# Patient Record
Sex: Female | Born: 2000 | Race: White | Hispanic: No | Marital: Single | Smoking: Never smoker
Health system: Southern US, Community
[De-identification: ages and names within clinical notes are randomized; demographics above are authoritative.]

## PROBLEM LIST (undated history)

## (undated) DIAGNOSIS — N6009 Solitary cyst of unspecified breast: Secondary | ICD-10-CM

## (undated) DIAGNOSIS — R569 Unspecified convulsions: Secondary | ICD-10-CM

## (undated) DIAGNOSIS — G40909 Epilepsy, unspecified, not intractable, without status epilepticus: Secondary | ICD-10-CM

## (undated) HISTORY — PX: BIOPSY BREAST: PRO8

## (undated) HISTORY — DX: Unspecified convulsions: R56.9

---
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
## 2018-02-01 ENCOUNTER — Other Ambulatory Visit (INDEPENDENT_AMBULATORY_CARE_PROVIDER_SITE_OTHER): Payer: Self-pay | Admitting: Pediatrics

## 2018-02-01 DIAGNOSIS — G40B09 Juvenile myoclonic epilepsy, not intractable, without status epilepticus: Secondary | ICD-10-CM

## 2018-02-01 MED FILL — DEPAKOTE ER 250 MG TABLET: 250 | 90 days supply | Qty: 270 | Fill #0

## 2018-02-06 ENCOUNTER — Ambulatory Visit (INDEPENDENT_AMBULATORY_CARE_PROVIDER_SITE_OTHER): Payer: 59 | Admitting: Pediatrics

## 2018-02-13 ENCOUNTER — Encounter (INDEPENDENT_AMBULATORY_CARE_PROVIDER_SITE_OTHER): Payer: Self-pay

## 2018-02-13 ENCOUNTER — Ambulatory Visit (INDEPENDENT_AMBULATORY_CARE_PROVIDER_SITE_OTHER): Payer: 59 | Admitting: Pediatrics

## 2018-02-13 VITALS — BP 110/70 | HR 74 | Ht 62.0 in | Wt 169.2 lb

## 2018-02-13 DIAGNOSIS — G40B09 Juvenile myoclonic epilepsy, not intractable, without status epilepticus: Secondary | ICD-10-CM | POA: Diagnosis not present

## 2018-02-13 MED ORDER — DEPAKOTE ER 250 MG PO TB24: 750.0000 mg | ORAL_TABLET | Freq: Every day | ORAL | 3 refills | Status: DC

## 2018-02-13 NOTE — Progress Notes (Signed)
Patient: Joan Shepard MRN: [id number] Sex: female DOB: [birth year]  Provider: Wyline Copas, MD Location of Care: [facility]  Note type: Routine return visit  History of Present Illness: Referral Source: Aleda Grana, MD History from: patient, [facility] chart and Mom Chief Complaint: Epilepsy, medication change/review  Joan Shepard is a 17 y.o. female who was evaluated on Feb 13, 2018 for the first time since January 03, 2017.  She has juvenile myoclonic epilepsy with history of migraine and tension-type headaches.  She has been treated with Lamictal, which affected her school performance, personality, and caused weight gain, topiramate and zonisamide did not control her seizures.  Depakote has completely controlled all of her seizures including generalized tonic-clonic and myoclonic seizures.  I explained to Joan Shepard that if we wanted to make a transition away from Depakote, that the next medication would be levetiracetam.  I forgotten how Lamictal had effected her.  I explained that we would introduce levetiracetam without any change in Depakote.  Once she was taking about 1500 mg a day, I would slowly taper Depakote.  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  She begins school on August 10.  She is going to travel to the beach and [place] this summer with her family.  I am pleased that she lost 10 pounds since I saw her 13 months ago.  She has been working out and also watching her oral intake.  She graduates in [employer] in another few weeks.  She has a permanent driver's license.  She had a problem with headaches, but did not raise any concerns about that today.  Headaches if they have been present, have been few and far between.  She is not keeping headache record.  Review of Systems: A complete review of systems was assessed and was negative.  Past Medical History Diagnosis Date   . Seizures ([place])    Hospitalizations: No., Head Injury: No., Nervous System Infections: No., Immunizations up to date: Yes.    She was admitted 4 days of life with perioral cellulitis and hospitalized for 7-10 days.  Seizures began in 2008. She was treated with Lamictal, but had problems with school performance, personality change, and weight gain. The medication was stopped and curiously she had no recurrent seizures until August 15, 2011.  EEG showed a photo-convulsive response.   EEG August 11, 2011 showed photoconvulsive response.   Prolonged ambulatory EEG showed seven episodes, three to five seconds in duration associated with clinical and compliments of unresponsive staring, rising of her arms, and shaking of them. She was placed on topiramate, which did not control her seizures. She was switched to zonisamide and had a flurry of seizures in early to mid December 2013.  In March 2014, she had a series of seizures at school where she lost balance, dropped her books, and had myoclonic movements of her arms and trunk without loss of consciousness.   A decision was made to start Depakote, which has proved beneficial for seizure control  Birth History 7 lbs. 6 oz. infant born at [redacted] weeks gestational age to a G 3 P 1 0 1 1 female. Gestation was uncomplicated Mother received Pitocin and Spinal anesthesia  normal spontaneous vaginal delivery Nursery Course was uncomplicated Growth and Development was recalled as normal  Behavior History none  Surgical History History reviewed. No pertinent surgical history.  Family History family history includes  ADD / ADHD in her other; Heart Problems in her maternal grandfather; Mood Disorder in her other; Seizures in her brother, maternal grandfather, maternal uncle, mother, other, and other. Family history is negative for migraines, intellectual disabilities, blindness, deafness, birth defects, chromosomal disorder, or  autism.  Social History Social Needs  . Financial resource strain: Not on file  . Food insecurity:    Worry: Not on file    Inability: Not on file  . Transportation needs:    Medical: Not on file    Non-medical: Not on file  Tobacco Use  . Smoking status: Never Smoker  . Smokeless tobacco: Never Used  Substance and Sexual Activity  . Alcohol use: No  . Drug use: No  . 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    She attends [employer].     She lives with her parents.     Enjoys volunteering and volleyball.    Allergies Allergen Reactions  . Lamotrigine     Weight gain, and altered mood   Physical Exam BP 110/70   Pulse 74   Ht 5\' 2"  (1.575 m)   Wt 169 lb 3.2 oz (76.7 kg)   BMI 30.95 kg/m   General: alert, well developed, obese, in no acute distress, sandy hair, blue eyes, right handed Head: normocephalic, no dysmorphic features Ears, Nose and Throat: Otoscopic: tympanic membranes normal; pharynx: oropharynx is pink without exudates or tonsillar hypertrophy Neck: supple, full range of motion, no cranial or cervical bruits Respiratory: auscultation clear Cardiovascular: no murmurs, pulses are normal Musculoskeletal: no skeletal deformities or apparent scoliosis Skin: no rashes or neurocutaneous lesions  Neurologic Exam  Mental Status: alert; oriented to person, place and year; knowledge is normal for age; language is normal Cranial Nerves: visual fields are full to double simultaneous stimuli; extraocular movements are full and conjugate; pupils are round reactive to light; funduscopic examination shows sharp disc margins with normal vessels; symmetric facial strength; midline tongue and uvula; air conduction is greater than bone conduction bilaterally Motor: Normal strength, tone and mass; good fine motor movements; no pronator drift Sensory: intact responses to cold, vibration, proprioception and  stereognosis Coordination: good finger-to-nose, rapid repetitive alternating movements and finger apposition Gait and Station: normal gait and station: patient is able to walk on heels, toes and tandem without difficulty; balance is adequate; Romberg exam is negative; Gower response is negative Reflexes: symmetric and diminished bilaterally; no clonus; bilateral flexor plantar responses  Assessment 1.  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  Discussion Joan Shepard is doing well.  However, we want to get her off of Depakote  but because of its possible teratogenic effects.  She is not sexually active at this time,  Plan It is my intention to introduce levetiracetam after she returns to school, although it would not be an unreasonable thing to start this summer to make certain that she can tolerate it.  I would place her on 500 mg tablets 1/2 twice daily for a week, 1 twice daily for a week, and then 1-1/2 twice daily.  Once she starts to taper Depakote, she would have to stop driving until she was off Depakote and seizure-free for at least 6 months.  We can certainly accomplish this in her freshman year.  If successful, this would be a much better situation for her with a significantly lowered risk of giving birth to a child with deformities when and if she  gets married and has a family.  The only other alternatives would be topiramate and zonisamide.  I spent 25 minutes of face-to-face time with Joan Shepard and her mother, more than half of it in consultation.  She will return over Christmas break about 7 months from now.  I asked her to sign up for MyChart and requested that she identify a pharmacy in [facility], so that we can send a prescription there to minimize any chances of her running out of medicine.  I refilled a prescription for Depakote ER 250 mg tablets 3 at bedtime.   Medication List    Accurate as of 02/13/18 11:59 PM.      DEPAKOTE ER 250 MG 24 hr  tablet Generic drug:  divalproex Take 3 tablets (750 mg total) by mouth at bedtime.   Melatonin 10 MG Tabs Take 10 mg by mouth at bedtime.    The medication list was reviewed and reconciled. All changes or newly prescribed medications were explained.  A complete medication list was provided to the patient/caregiver.  Princess Bruins Babita Amaker

## 2018-02-13 NOTE — Patient Instructions (Signed)
Have a great summer.  I like to see you when you are home for Christmas holiday.  Please sign up for My Chart so that you can communicate with me if there are any problems with your medication or you have recurrent seizures.  We talked about the medicines Keppra (levetiracetam), and Lamictal (lamotrigine).  Keppra is likely to treat your myoclonus whereas with ictal sometimes treats myoclonus and sometimes makes it worse.  Both would be safe for you to go into adulthood with.  We will not have any trouble introducing Keppra.  Lamictal and Depakote share the same enzyme system and that will be tricky.

## 2018-04-12 ENCOUNTER — Other Ambulatory Visit: Payer: Self-pay

## 2018-04-12 ENCOUNTER — Encounter (HOSPITAL_COMMUNITY): Payer: Self-pay | Admitting: Obstetrics and Gynecology

## 2018-04-12 ENCOUNTER — Emergency Department (HOSPITAL_COMMUNITY)
Admission: EM | Admit: 2018-04-12 | Discharge: 2018-04-12 | Disposition: A | Discharge status: Home / Self Care | Payer: 59 | Attending: Emergency Medicine | Admitting: Emergency Medicine

## 2018-04-12 DIAGNOSIS — S060X0A Concussion without loss of consciousness, initial encounter: Secondary | ICD-10-CM | POA: Diagnosis not present

## 2018-04-12 DIAGNOSIS — Z041 Encounter for examination and observation following transport accident: Secondary | ICD-10-CM | POA: Diagnosis present

## 2018-04-12 DIAGNOSIS — Z79899 Other long term (current) drug therapy: Secondary | ICD-10-CM | POA: Insufficient documentation

## 2018-04-12 DIAGNOSIS — S50811A Abrasion of right forearm, initial encounter: Secondary | ICD-10-CM | POA: Diagnosis not present

## 2018-04-12 DIAGNOSIS — Y999 Unspecified external cause status: Secondary | ICD-10-CM | POA: Insufficient documentation

## 2018-04-12 DIAGNOSIS — Y929 Unspecified place or not applicable: Secondary | ICD-10-CM | POA: Insufficient documentation

## 2018-04-12 DIAGNOSIS — R42 Dizziness and giddiness: Secondary | ICD-10-CM | POA: Insufficient documentation

## 2018-04-12 DIAGNOSIS — R110 Nausea: Secondary | ICD-10-CM | POA: Insufficient documentation

## 2018-04-12 DIAGNOSIS — Y939 Activity, unspecified: Secondary | ICD-10-CM | POA: Diagnosis not present

## 2018-04-12 HISTORY — DX: Epilepsy, unspecified, not intractable, without status epilepticus: G40.909

## 2018-04-12 HISTORY — DX: Solitary cyst of unspecified breast: N60.09
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
## 2018-04-12 NOTE — Discharge Instructions (Addendum)
Your symptoms are most likely due to a concussion. Read attached information on concussion.   Concussion symptoms include include headache, nausea, sleep disturbance, dizziness, short term memory loss. Usually these symptom improve slowly over a period of 2-3 weeks, usually sooner. These symptoms wax and wane, and can be worsened by physical or cognitive exertion like reading, watching Tv, studying, using computer, playing video games.   Take acetaminophen or ibuprofen for associated pain. We recommend physical and cognitive rest for the next 48-72 hours. This means avoid any exertional or cognitive activity that makes your symptoms worse including reading, playing video games, watching TV, exercising, jumping, playing sports.  The most important part of concussion management is avoiding a repeat head injury, do not perform any activities that will put you at risk for a second head injury. You must be cleared by a clinician before fully returning to contact sports. If your symptoms last longer than 2-3 week you need to further evaluation by primary care provider or concussion specialist.   Return to the emergency department if you develop mental status change, lethargy, vomiting, vision changes, worsening headaches, numbness or heaviness/weakness to extremities, seizures.

## 2018-04-30 MED FILL — DEPAKOTE ER 250 MG TABLET: 250 | 90 days supply | Qty: 270 | Fill #1

## 2018-05-31 ENCOUNTER — Encounter (INDEPENDENT_AMBULATORY_CARE_PROVIDER_SITE_OTHER): Payer: Self-pay

## 2018-07-29 MED FILL — DEPAKOTE ER 250 MG TABLET: 250 | 90 days supply | Qty: 270 | Fill #2

## 2018-09-13 ENCOUNTER — Ambulatory Visit (INDEPENDENT_AMBULATORY_CARE_PROVIDER_SITE_OTHER): Payer: 59 | Admitting: Pediatrics

## 2018-09-13 ENCOUNTER — Encounter (INDEPENDENT_AMBULATORY_CARE_PROVIDER_SITE_OTHER): Payer: Self-pay | Admitting: Pediatrics

## 2018-09-13 VITALS — BP 120/80 | HR 76 | Ht 62.25 in | Wt 155.0 lb

## 2018-09-13 DIAGNOSIS — G40B09 Juvenile myoclonic epilepsy, not intractable, without status epilepticus: Secondary | ICD-10-CM | POA: Diagnosis not present

## 2018-09-13 MED ORDER — DEPAKOTE ER 250 MG PO TB24: 750.0000 mg | ORAL_TABLET | Freq: Every day | ORAL | 3 refills | Status: DC

## 2018-09-13 NOTE — Patient Instructions (Signed)
I am pleased that you are doing well.  Please return this spring and we will start levetiracetam.

## 2018-09-13 NOTE — Progress Notes (Signed)
Patient: Joan Shepard MRN: [id number] Sex: female DOB: 2001-06-19  Provider: Wyline Copas, MD Location of Care: [facility]  Note type: Routine return visit  History of Present Illness: Referral Source: Aleda Grana, MD History from: mother, patient and [facility] chart Chief Complaint: Epilepsy/Medication change  Joan Shepard is a 17 y.o. female who returns September 13, 2018 for first time since Feb 13, 2018.  She has juvenile myoclonic epilepsy and a history of migraine and tension-type headaches.  Lamictal affected her school performance, personality, and caused weight gain, topiramate and zonisamide did not control her seizures.  Depakote completely controlled her seizures.  I do not want her going into adulthood on Depakote unless it is there is no other option.  I recommended introducing levetiracetam at a time when she can not have her life significantly affected by the needing to drive.  She has just finished her first term at [employer] and had a good semester.  She came back today to consider whether we should discontinue medication, but she needs to be able to drive this summer.  We decided to keep her on Depakote until she heads back to school this fall.  She tells me that she has had a couple of episodes where she had shivering and/or muscle spasms while lying in bed trying to fall asleep.  She had been out late and had taken her medication late.  She did not feel the same way that she feels when she experiences seizures.  I am not certain that these episodes reflect seizures.  These occurred about a month apart.  After that she has been more careful to take her medication at a set time and when she has done so, the episodes did not recur.  She takes Loestrin to control her periods.  She has lost 14 pounds since I saw her.  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  She is majoring in Freight forwarder.  Review of Systems: A complete  review of systems was remarkable for patient reports that she has concerns about shivering. She does not know if she is having seizures or if she is just shivering, all other systems reviewed and negative.  Past Medical History Diagnosis Date  . Breast cyst   . Epilepsy ([place])    Hospitalizations: No., Head Injury: No., Nervous System Infections: No., Immunizations up to date: Yes.    She was admitted 4 days of life with perioral cellulitis and hospitalized for 7-10 days.  Seizures began in 2008. She was treated with Lamictal, but had problems with school performance, personality change, and weight gain. The medication was stopped and curiously she had no recurrent seizures until August 15, 2011.  EEG showed a photo-convulsive response.   EEG August 11, 2011 showed photoconvulsive response.   Prolonged ambulatory EEG showed seven episodes, three to five seconds in duration associated with clinical and compliments of unresponsive staring, rising of her arms, and shaking of them. She was placed on topiramate, which did not control her seizures. She was switched to zonisamide and had a flurry of seizures in early to mid December 2013.  In March 2014, she had a series of seizures at school where she lost balance,dropped her books,and had myoclonic movements of her arms and trunk without loss of consciousness.   A decision was made to start Depakote, which has proved beneficial for seizure control  Birth History 7 lbs. 6 oz. infant born at [redacted]  weeks gestational age to a G 3 P 1 0 1 1 female. Gestation was uncomplicated Mother received Pitocin and Spinal anesthesia  normal spontaneous vaginal delivery Nursery Course was uncomplicated Growth and Development was recalled as normal  Behavior History none  Surgical History Procedure Laterality Date  . BIOPSY BREAST     Family History family history includes ADD / ADHD in an other family member; Heart Problems in her  maternal grandfather; Mood Disorder in an other family member; Seizures in her brother, maternal grandfather, maternal uncle, mother, and other family members. Family history is negative for migraines, seizures, intellectual disabilities, blindness, deafness, birth defects, chromosomal disorder, or autism.  Social History Social Needs  . Financial resource strain: Not on file  . Food insecurity:    Worry: Not on file    Inability: Not on file  . Transportation needs:    Medical: Not on file    Non-medical: Not on file  Tobacco Use  . Smoking status: Never Smoker  . Smokeless tobacco: Never Used  Substance and Sexual Activity  . Alcohol use: No  . Drug use: No  . 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    She attended [employer].     She is a Museum/gallery exhibitions officer at [employer]     Enjoys volunteering and volleyball.    Allergies Allergen Reactions  . Lamotrigine     Weight gain, and altered mood   Physical Exam BP 120/80   Pulse 76   Ht 5' 2.25" (1.581 m)   Wt 155 lb (70.3 kg)   BMI 28.12 kg/m   General: alert, well developed, well nourished, in no acute distress, red hair, blue eyes, right handed Head: normocephalic, no dysmorphic features Ears, Nose and Throat: Otoscopic: tympanic membranes normal; pharynx: oropharynx is pink without exudates or tonsillar hypertrophy Neck: supple, full range of motion, no cranial or cervical bruits Respiratory: auscultation clear Cardiovascular: no murmurs, pulses are normal Musculoskeletal: no skeletal deformities or apparent scoliosis Skin: no rashes or neurocutaneous lesions  Neurologic Exam  Mental Status: alert; oriented to person, place and year; knowledge is normal for age; language is normal Cranial Nerves: visual fields are full to double simultaneous stimuli; extraocular movements are full and conjugate; pupils are round reactive to light; funduscopic  examination shows sharp disc margins with normal vessels; symmetric facial strength; midline tongue and uvula; air conduction is greater than bone conduction bilaterally Motor: Normal strength, tone and mass; good fine motor movements; no pronator drift Sensory: intact responses to cold, vibration, proprioception and stereognosis Coordination: good finger-to-nose, rapid repetitive alternating movements and finger apposition Gait and Station: normal gait and station: patient is able to walk on heels, toes and tandem without difficulty; balance is adequate; Romberg exam is negative; Gower response is negative Reflexes: symmetric and diminished bilaterally; no clonus; bilateral flexor plantar responses  Assessment 1.  Juvenile myoclonic epilepsy, not intractable, without status epilepticus, G40.b09.  Discussion: I talked with Joan Shepard at length about my concerns and related to sorority in college.  I want to make certain that she is in control of her life and does not fall to peer pressure either due to severely sleep-deprived herself, or to drink alcohol to excess.  Either of these would lower seizure threshold and could very likely end up in her experiencing the seizure.  I cannot determine for certain what happened when she took her medication late and was trying to fall  asleep.  She could very well have had some sleep-related myoclonus.  My hope is that levetiracetam will keep her seizures under control when Depakote is discontinued.  She will tolerate the medication well in my opinion.  There would not be significant side effects and this is unknown broad-spectrum anti epileptic medication.  Nonetheless, that she has failed a number of other antiepileptic drugs, this is far from certain.  Plan I refilled her prescription for Depakote.  She will return to see me in late May, 2020 after she completes her freshman year at [employer].  I asked her to keep in touch with me if there are any other  problems in the interim.  Greater than 50% of a 25 minute visit was spent in counseling and coordination of care.   Medication List   Accurate as of September 13, 2018 12:12 PM.    DEPAKOTE ER 250 MG 24 hr tablet Generic drug:  divalproex Take 3 tablets (750 mg total) by mouth at bedtime.   Melatonin 5 MG Tabs Take 15 mg by mouth at bedtime.    The medication list was reviewed and reconciled. All changes or newly prescribed medications were explained.  A complete medication list was provided to the patient/caregiver.  Jodi Geralds MD

## 2018-10-27 ENCOUNTER — Encounter (INDEPENDENT_AMBULATORY_CARE_PROVIDER_SITE_OTHER): Payer: Self-pay

## 2018-10-27 DIAGNOSIS — G40B09 Juvenile myoclonic epilepsy, not intractable, without status epilepticus: Secondary | ICD-10-CM

## 2018-10-28 MED ORDER — DEPAKOTE ER 250 MG PO TB24: 750.0000 mg | ORAL_TABLET | Freq: Every day | ORAL | 3 refills | Status: DC

## 2018-10-28 NOTE — Telephone Encounter (Signed)
Please send to the pharmacy °

## 2018-10-31 MED FILL — DEPAKOTE ER 250 MG TABLET: 250 | 90 days supply | Qty: 270 | Fill #3 | Status: TO

## 2018-12-02 MED FILL — LO LOESTRIN FE 1-10 TABLET: 1 MG-10 MCG | 84 days supply | Qty: 84 | Fill #0

## 2018-12-02 MED FILL — SHIPPING COST: 1 days supply | Qty: 1 | Fill #0

## 2018-12-04 MED FILL — LO LOESTRIN FE 1-10 TABLET: 1 MG-10 MCG | 28 days supply | Qty: 28 | Fill #1

## 2019-01-22 ENCOUNTER — Other Ambulatory Visit (INDEPENDENT_AMBULATORY_CARE_PROVIDER_SITE_OTHER): Payer: Self-pay | Admitting: Pediatrics

## 2019-01-22 DIAGNOSIS — G40B09 Juvenile myoclonic epilepsy, not intractable, without status epilepticus: Secondary | ICD-10-CM

## 2019-01-22 MED FILL — DEPAKOTE ER 250 MG TABLET: 250 | 90 days supply | Qty: 270 | Fill #0 | Status: TO

## 2019-01-22 NOTE — Telephone Encounter (Signed)
Please send to the pharmacy °

## 2019-03-12 MED FILL — LO LOESTRIN FE 1-10 TABLET: 1 MG-10 MCG | 28 days supply | Qty: 28 | Fill #2

## 2019-04-09 MED FILL — LO LOESTRIN FE 1-10 TABLET: 1 MG-10 MCG | 56 days supply | Qty: 56 | Fill #3

## 2019-04-09 MED FILL — DEPAKOTE ER 250 MG TABLET: 250 | 90 days supply | Qty: 270 | Fill #1 | Status: TO

## 2019-04-24 ENCOUNTER — Ambulatory Visit (INDEPENDENT_AMBULATORY_CARE_PROVIDER_SITE_OTHER): Payer: 59 | Admitting: Psychology

## 2019-04-24 DIAGNOSIS — F4310 Post-traumatic stress disorder, unspecified: Secondary | ICD-10-CM

## 2019-04-30 ENCOUNTER — Ambulatory Visit (INDEPENDENT_AMBULATORY_CARE_PROVIDER_SITE_OTHER): Payer: 59 | Admitting: Psychology

## 2019-04-30 DIAGNOSIS — F4310 Post-traumatic stress disorder, unspecified: Secondary | ICD-10-CM

## 2019-05-05 ENCOUNTER — Ambulatory Visit: Payer: 59 | Admitting: Psychology

## 2019-05-13 ENCOUNTER — Ambulatory Visit (INDEPENDENT_AMBULATORY_CARE_PROVIDER_SITE_OTHER): Payer: 59 | Admitting: Psychology

## 2019-05-13 DIAGNOSIS — F4310 Post-traumatic stress disorder, unspecified: Secondary | ICD-10-CM

## 2019-05-22 ENCOUNTER — Ambulatory Visit (INDEPENDENT_AMBULATORY_CARE_PROVIDER_SITE_OTHER): Payer: 59 | Admitting: Psychology

## 2019-05-22 DIAGNOSIS — F4310 Post-traumatic stress disorder, unspecified: Secondary | ICD-10-CM | POA: Diagnosis not present

## 2019-05-29 ENCOUNTER — Ambulatory Visit: Payer: 59 | Admitting: Psychology

## 2019-07-08 MED FILL — DEPAKOTE ER 250 MG TABLET: 250 | 90 days supply | Qty: 270 | Fill #2 | Status: TO

## 2019-07-25 ENCOUNTER — Telehealth (INDEPENDENT_AMBULATORY_CARE_PROVIDER_SITE_OTHER): Payer: Self-pay | Admitting: Pediatrics

## 2019-07-25 NOTE — Telephone Encounter (Signed)
°  Who's calling (name and relationship to patient) : Stanton Kidney (Mother)  Best contact number: [phone] Provider they see: Dr. Gaynell Face  Reason for call: Mother dropped off pt's DMV forms. I placed forms in provider's box. Please fax forms to [facility] after they have been completed. Mom would like the forms to be mailed back to the home address on file after forms have been faxed.

## 2019-07-28 NOTE — Telephone Encounter (Signed)
Forms have been placed on Dr. Hickling's desk 

## 2019-07-30 NOTE — Telephone Encounter (Signed)
Forms have been completed and returned to [facility] for faxing and scanning.

## 2019-07-30 NOTE — Telephone Encounter (Signed)
Forms have been faxed to the [facility]

## 2019-08-25 ENCOUNTER — Telehealth (INDEPENDENT_AMBULATORY_CARE_PROVIDER_SITE_OTHER): Payer: Self-pay | Admitting: Radiology

## 2019-08-25 ENCOUNTER — Telehealth (INDEPENDENT_AMBULATORY_CARE_PROVIDER_SITE_OTHER): Payer: Self-pay | Admitting: Pediatrics

## 2019-08-25 NOTE — Telephone Encounter (Signed)
Forms have been put in Dr. Melanee Left possession

## 2019-08-25 NOTE — Telephone Encounter (Signed)
Patient dropped off a DMV form to be completed and signed by Dr. Gaynell Face. Please fax to [facility] DMV at [phone] once completed. These forms have been placed in Dr. Melanee Left box at the front. Cameron Sprang

## 2019-08-25 NOTE — Telephone Encounter (Signed)
Patient came in today and dropped off some DMV paperwork for Dr Gaynell Face to complete. Completed a two way release for the [facility] so [facility] can send info to the [facility] via Fax.  Scanned in chart and placed forms in providers box. Patient also advised that the dmv will need this paper work faxed back by 09/01/2019

## 2019-08-29 ENCOUNTER — Other Ambulatory Visit: Payer: Self-pay

## 2019-08-29 DIAGNOSIS — Z20822 Contact with and (suspected) exposure to covid-19: Secondary | ICD-10-CM

## 2019-08-29 NOTE — Telephone Encounter (Signed)
Completed and given to Tiffanie.

## 2019-09-01 ENCOUNTER — Telehealth (INDEPENDENT_AMBULATORY_CARE_PROVIDER_SITE_OTHER): Payer: Self-pay | Admitting: Pediatrics

## 2019-09-01 LAB — NOVEL CORONAVIRUS, NAA: SARS-CoV-2, NAA: NOT DETECTED

## 2019-09-01 NOTE — Telephone Encounter (Signed)
See phone note from 08/25/2019. Tiffany faxed a copy to the [place] DMV. Patient came to office and was given the original copy for her records. Joan Shepard

## 2019-09-03 ENCOUNTER — Telehealth: Payer: Self-pay

## 2019-09-03 ENCOUNTER — Other Ambulatory Visit: Payer: Self-pay

## 2019-09-03 ENCOUNTER — Telehealth (INDEPENDENT_AMBULATORY_CARE_PROVIDER_SITE_OTHER): Payer: Self-pay | Admitting: Pediatrics

## 2019-09-03 DIAGNOSIS — Z20822 Contact with and (suspected) exposure to covid-19: Secondary | ICD-10-CM

## 2019-09-03 NOTE — Telephone Encounter (Signed)
Can patient have a virtual appt on 12/11?

## 2019-09-03 NOTE — Telephone Encounter (Signed)
Caller given negative result and verbalized understanding  

## 2019-09-03 NOTE — Telephone Encounter (Signed)
Yes

## 2019-09-04 ENCOUNTER — Encounter (INDEPENDENT_AMBULATORY_CARE_PROVIDER_SITE_OTHER): Payer: Self-pay

## 2019-09-04 ENCOUNTER — Telehealth (INDEPENDENT_AMBULATORY_CARE_PROVIDER_SITE_OTHER): Payer: Self-pay | Admitting: Pediatrics

## 2019-09-04 NOTE — Telephone Encounter (Signed)
  Who's calling (name and relationship to patient) : Kriselda (Self)  Best contact number: [phone] Provider they see: Dr. Gaynell Face  Reason for call: Pt stated that she received documents from [facility] stating that a letter was needed from Dr. Gaynell Face indicating last event (seizure) and if their was loss of consciousness. Pt will be sending a picture of the document via Mychart for Dr. Melanee Left review. Pt would like to know if Dr. Gaynell Face could have the letter written by the end of the day tomorrow.

## 2019-09-04 NOTE — Telephone Encounter (Signed)
Please refer to MyChart message that has already been routed

## 2019-09-05 ENCOUNTER — Encounter (INDEPENDENT_AMBULATORY_CARE_PROVIDER_SITE_OTHER): Payer: Self-pay | Admitting: Pediatrics

## 2019-09-05 ENCOUNTER — Ambulatory Visit (INDEPENDENT_AMBULATORY_CARE_PROVIDER_SITE_OTHER): Payer: 59 | Admitting: Pediatrics

## 2019-09-05 ENCOUNTER — Other Ambulatory Visit: Payer: Self-pay

## 2019-09-05 VITALS — Wt 160.0 lb

## 2019-09-05 DIAGNOSIS — G40B09 Juvenile myoclonic epilepsy, not intractable, without status epilepticus: Secondary | ICD-10-CM | POA: Diagnosis not present

## 2019-09-05 LAB — NOVEL CORONAVIRUS, NAA: SARS-CoV-2, NAA: NOT DETECTED

## 2019-09-05 NOTE — Patient Instructions (Addendum)
I am pleased that you are doing well, that there are no seizures, and you are tolerating your medicine.  I am pleased that your weight is stable.  I am also happy that you are continuing to make progress in college.  I will plan to see you in a year.  The prescription will be need to be refilled in April 2021 which I will do.  I sent a copy of the letter that was sent to Division of [employer] but state of [place].  If there is anything else I can do to help I will be happy to do so.  It was good to see you today.  1 year 1 year returns

## 2019-09-05 NOTE — Progress Notes (Signed)
This is a Pediatric Specialist E-Visit follow up consult provided via [place] consented to an E-Visit consult today.  Location of patient: [name] is at Home (location) Location of provider: Wyline Copas, MD is at Office (location) Patient was referred by Henreitta Cea, MD   The following participants were involved in this E-Visit: Sabino Niemann, CMA      Wyline Copas, MD  Total time on call: 15 minutes Follow up: 1 year  Patient: Joan Shepard MRN: [id number] Sex: female DOB: 10/03/2000  Provider: Wyline Copas, MD Location of Care: [facility]  Note type: Routine return visit  History of Present Illness: History from: patient and [facility] chart Chief Complaint: Epilepsy  Joan Shepard is a 18 y.o. female who was evaluated virtually September 05, 2019 for the first time since September 13, 2018.  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  She has had considerable difficulty tolerating Lamictal, or responding to topiramate or zonisamide.  Because Depakote is known to cause significant birth defects in children, I am trying to stay away from this medication.  Interestingly the same issue happened with her mother who has been on Depakote for decades completely controlling her seizures.  Today's visit was imperative because of her [place] driver's license.  I mentioned in a form filled out August 29, 2019 that she had some episodes of nocturnal twitching in 2019.  The board wanted greater written clarification of that which I provided today.  I am happy that service had no further episodes.  What appeared to be happening was that she would go to bed late and consequently take her medication late and as she was settling down to go to sleep would have some movements that may have been myoclonic in nature but work typical for her myoclonus.  Whether or not this truly represented breakthrough  seizures was unclear.  What was clear is that she did not lose consciousness nor did this evolve into a generalized tonic-clonic seizure.  Her health is good.  She says that her weight is stable.  She has been watching what she eats and trying to increase her physical activity.  She is sleeping well, going to bed at 10 PM and sleeping until 8 or 9 AM.  The dorm is quite noisy.  She typically takes melatonin to help her fall asleep and puts on background sound.  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  Her grades are all A's and B's.  She tells me that she often experiences forgetful behavior, sometimes replies to interactions with her friends other times with school assignments.  In general the fact that she is doing so well in school makes it likely that this is a matter of focus and attention.  She came home from school to take her final examinations during the Thanksgiving vacation and will not return to school until the second week in January.  She intends to work at the [place] home during the break.   She receives a year supply of medication from [facility] in 90-day allotments which are shipped to her at school.  This needs to be refilled in April 2021.  Review of Systems: A complete review of systems was remarkable for short term memory loss, all other systems reviewed and negative.  Past Medical History Diagnosis Date  . Breast cyst   .  Epilepsia ([place])   . Seizures ([place])    Hospitalizations: No., Head Injury: No., Nervous System Infections: No., Immunizations up to date: Yes.    Copied from prior chart She was admitted 4 days of life with perioral cellulitis and hospitalized for 7-10 days.  Seizures began in 2008. She was treated with Lamictal, but had problems with school performance, personality change, and weight gain. The medication was stopped and curiously she had no recurrent seizures until August 15, 2011.  EEG showed a photo-convulsive response.   EEG August 11, 2011 showed photoconvulsive response.   Prolonged ambulatory EEG showed seven episodes, three to five seconds in duration associated with clinical and compliments of unresponsive staring, rising of her arms, and shaking of them. She was placed on topiramate, which did not control her seizures. She was switched to zonisamide and had a flurry of seizures in early to mid December 2013.  In March 2014, she had a series of seizures at school where she lost balance,dropped her books,and had myoclonic movements of her arms and trunk without loss of consciousness.   A decision was made to start Depakote, which has proved beneficial for seizure control  Birth History 7 lbs. 6 oz. infant born at [redacted] weeks gestational age to a G 3 P 1 0 1 1 female. Gestation was uncomplicated Mother received Pitocin and Spinal anesthesia  normal spontaneous vaginal delivery Nursery Course was uncomplicated Growth and Development was recalled as normal  Behavior History none  Surgical History Procedure Laterality Date  . BIOPSY BREAST     Family History family history includes ADD / ADHD in an other family member; Heart Problems in her maternal grandfather; Mood Disorder in an other family member; Seizures in her brother, maternal grandfather, maternal uncle, mother, and other family members. Family history is negative for migraines, intellectual disabilities, blindness, deafness, birth defects, chromosomal disorder, or autism.  Social History Socioeconomic History  . Marital status: Single  . Years of education:  56  . Highest education level:  Sophomore in college  Occupational History  .  Will work part-time at [employer] during the Christmas break  Tobacco Use  . Smoking status: Never Smoker  . Smokeless tobacco: Never Used  Substance and Sexual Activity  . Alcohol use: No  . Drug use: No  . Sexual activity: Never   Social History Narrative    Rebbie is a sophmore at [employer]     Enjoys volunteering and volleyball.    Allergies Allergen Reactions  . Lamotrigine     Weight gain, and altered mood   Physical Exam Wt 160 lb (72.6 kg) Comment: reported  BMI 29.03 kg/m   General: alert, well developed, well nourished, in no acute distress, red hair, blue eyes, right handed Head: normocephalic, no dysmorphic features Neck: supple, full range of motion Musculoskeletal: no skeletal deformities or apparent scoliosis Skin: no rashes or neurocutaneous lesions  Neurologic Exam  Mental Status: alert; oriented to person, place and year; knowledge is normal for age; language is normal Cranial Nerves: visual fields are full to double simultaneous stimuli; extraocular movements are full and conjugate; symmetric facial strength; midline tongue; hearing is normal bilaterally Motor: normal functional strength, tone and mass; good fine motor movements; no pronator drift Coordination: good finger-to-nose, rapid repetitive alternating movements and finger apposition Gait and Station: normal gait and station: patient is able to walk on heels, toes and tandem without difficulty; balance is adequate; Romberg exam is negative; Gower response is negative  Assessment 1.  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  Discussion I am pleased that Joan Shepard is doing well.  There is no reason to change her current treatment.  I wrote a letter to Division of [employer], stated [place] urging that her license be renewed.  Plan She will return in 1 year for routine visit.  I will see her sooner based on clinical need.  Greater than 25% of 15-minute visit was spent in counseling and coordination of care concerning her seizures or general physical situation and discussing the letter that I wrote on her behalf.   Medication List   Accurate as of September 05, 2019  11:57 AM. If you have any questions, ask your nurse or doctor.    Depakote ER 250 MG 24 hr tablet Generic drug: divalproex TAKE 3 TABLETS BY MOUTH AT BEDTIME   Lo Loestrin Fe 1 MG-10 MCG / 10 MCG tablet Generic drug: Norethindrone-Ethinyl Estradiol-Fe Biphas Take 1 tablet by mouth daily.   Melatonin 5 MG Tabs Take 15 mg by mouth at bedtime.    The medication list was reviewed and reconciled. All changes or newly prescribed medications were explained.  A complete medication list was provided to the patient/caregiver.  Jodi Geralds MD

## 2019-10-06 MED FILL — DEPAKOTE ER 250 MG TABLET: 250 | 90 days supply | Qty: 270 | Fill #3 | Status: TO

## 2019-10-07 ENCOUNTER — Ambulatory Visit: Payer: 59 | Attending: Internal Medicine

## 2019-10-07 DIAGNOSIS — Z20822 Contact with and (suspected) exposure to covid-19: Secondary | ICD-10-CM | POA: Diagnosis not present

## 2019-10-08 LAB — NOVEL CORONAVIRUS, NAA: SARS-CoV-2, NAA: NOT DETECTED

## 2019-10-20 ENCOUNTER — Other Ambulatory Visit (INDEPENDENT_AMBULATORY_CARE_PROVIDER_SITE_OTHER): Payer: Self-pay | Admitting: Pediatrics

## 2019-10-20 DIAGNOSIS — G40B09 Juvenile myoclonic epilepsy, not intractable, without status epilepticus: Secondary | ICD-10-CM

## 2019-10-22 MED FILL — DEPAKOTE ER 250 MG TABLET: 250 | 29 days supply | Qty: 87 | Fill #0 | Status: TO

## 2019-10-27 ENCOUNTER — Ambulatory Visit: Payer: 59 | Attending: Internal Medicine

## 2019-10-27 DIAGNOSIS — Z20822 Contact with and (suspected) exposure to covid-19: Secondary | ICD-10-CM

## 2019-10-28 LAB — NOVEL CORONAVIRUS, NAA: SARS-CoV-2, NAA: NOT DETECTED

## 2019-11-15 MED FILL — DEPAKOTE ER 250 MG TABLET: 250 | 90 days supply | Qty: 270 | Fill #1 | Status: TO

## 2020-02-12 MED FILL — DEPAKOTE ER 250 MG TABLET: 250 | 90 days supply | Qty: 270 | Fill #2 | Status: TO

## 2020-03-11 ENCOUNTER — Other Ambulatory Visit (HOSPITAL_COMMUNITY): Payer: Self-pay | Admitting: Obstetrics and Gynecology

## 2020-03-11 DIAGNOSIS — Z01419 Encounter for gynecological examination (general) (routine) without abnormal findings: Secondary | ICD-10-CM | POA: Diagnosis not present

## 2020-03-11 DIAGNOSIS — Z6831 Body mass index (BMI) 31.0-31.9, adult: Secondary | ICD-10-CM | POA: Diagnosis not present

## 2020-03-11 MED FILL — LO LOESTRIN FE 1-10 TABLET: 1 MG-10 MCG | 28 days supply | Qty: 28 | Fill #0

## 2020-04-20 ENCOUNTER — Ambulatory Visit (HOSPITAL_COMMUNITY): Payer: Self-pay

## 2020-04-22 MED FILL — LO LOESTRIN FE 1-10 TABLET: 1 MG-10 MCG | 28 days supply | Qty: 28 | Fill #1

## 2020-04-27 DIAGNOSIS — Z23 Encounter for immunization: Secondary | ICD-10-CM | POA: Diagnosis not present

## 2020-05-12 MED FILL — DEPAKOTE ER 250 MG TABLET: 250 | 90 days supply | Qty: 270 | Fill #3 | Status: TO

## 2020-05-14 MED FILL — LO LOESTRIN FE 1-10 TABLET: 1 MG-10 MCG | 84 days supply | Qty: 84 | Fill #2

## 2020-06-15 DIAGNOSIS — Z20822 Contact with and (suspected) exposure to covid-19: Secondary | ICD-10-CM | POA: Diagnosis not present

## 2020-08-06 MED FILL — LO LOESTRIN FE 1-10 TABLET: 1 MG-10 MCG | 84 days supply | Qty: 84 | Fill #3

## 2020-08-10 ENCOUNTER — Other Ambulatory Visit (INDEPENDENT_AMBULATORY_CARE_PROVIDER_SITE_OTHER): Payer: Self-pay | Admitting: Pediatrics

## 2020-08-10 DIAGNOSIS — G40B09 Juvenile myoclonic epilepsy, not intractable, without status epilepticus: Secondary | ICD-10-CM

## 2020-08-11 ENCOUNTER — Other Ambulatory Visit (INDEPENDENT_AMBULATORY_CARE_PROVIDER_SITE_OTHER): Payer: Self-pay | Admitting: Neurology

## 2020-08-11 MED FILL — DEPAKOTE ER 250 MG TABLET: 250 | 90 days supply | Qty: 270 | Fill #0

## 2020-08-11 NOTE — Telephone Encounter (Signed)
Please send to the pharmacy °

## 2020-09-06 ENCOUNTER — Other Ambulatory Visit (HOSPITAL_COMMUNITY): Payer: Self-pay | Admitting: *Deleted

## 2020-09-06 MED FILL — HYDROCODON-APAP 5-325: 5-325 | 4 days supply | Qty: 14 | Fill #0

## 2020-09-06 MED FILL — PENICILLIN VK 500 MG TABLET: 500 | 7 days supply | Qty: 28 | Fill #0

## 2020-10-29 MED FILL — LO LOESTRIN FE 1-10 TABLET: 1 MG-10 MCG | 84 days supply | Qty: 84 | Fill #4

## 2020-11-04 ENCOUNTER — Telehealth (INDEPENDENT_AMBULATORY_CARE_PROVIDER_SITE_OTHER): Payer: Self-pay | Admitting: Pediatrics

## 2020-11-04 NOTE — Telephone Encounter (Signed)
I called the patient.  Seizure free.  Has appt March 9. I let the patient know that I would complete the form today.  It was sent 09/16/2020 while she was at home to her school address.  I received it today.

## 2020-11-04 NOTE — Telephone Encounter (Signed)
Forms have been placed on Dr. Hickling's desk 

## 2020-11-04 NOTE — Telephone Encounter (Signed)
  Who's calling (name and relationship to patient) : Habiba ( mom)  Best contact number:[phone]  Provider they see: Dr Gaynell Face  Reason for call: Dad came by to drop off DMV paperwork for the patient he would just like a quick message when paperwork has been faxed please [place]  Name of prescription:  Pharmacy:

## 2020-12-01 ENCOUNTER — Other Ambulatory Visit: Payer: Self-pay

## 2020-12-01 ENCOUNTER — Ambulatory Visit (INDEPENDENT_AMBULATORY_CARE_PROVIDER_SITE_OTHER): Payer: 59 | Admitting: Pediatrics

## 2020-12-01 ENCOUNTER — Other Ambulatory Visit (INDEPENDENT_AMBULATORY_CARE_PROVIDER_SITE_OTHER): Payer: Self-pay | Admitting: Pediatrics

## 2020-12-01 ENCOUNTER — Encounter (INDEPENDENT_AMBULATORY_CARE_PROVIDER_SITE_OTHER): Payer: Self-pay | Admitting: Pediatrics

## 2020-12-01 VITALS — BP 112/80 | HR 72 | Ht 62.0 in | Wt 178.3 lb

## 2020-12-01 DIAGNOSIS — G40B09 Juvenile myoclonic epilepsy, not intractable, without status epilepticus: Secondary | ICD-10-CM | POA: Diagnosis not present

## 2020-12-01 MED ORDER — DEPAKOTE ER 250 MG PO TB24: 750.0000 mg | ORAL_TABLET | Freq: Every day | ORAL | 3 refills | Status: DC

## 2020-12-01 MED FILL — DEPAKOTE ER 250 MG TABLET: 250 | 90 days supply | Qty: 270 | Fill #0

## 2020-12-01 NOTE — Patient Instructions (Addendum)
I am pleased that you are doing well.  I refilled your prescription for a years worth of Depakote ER.  I will make a referral to Dr. Delice Lesch.  I will also try to talk to her.  You should be seen sometime in the next 6 months.  I will be happy to continue to provide care to you until I have retired June 24, 2021

## 2020-12-01 NOTE — Progress Notes (Addendum)
Patient: Joan Shepard MRN: [id number] Sex: female DOB: [birth year]  Provider: Wyline Copas, MD Location of Care: [facility]  Note type: Routine return visit  History of Present Illness: Referral Source: Aleda Grana, MD History from: patient and [facility] chart Chief Complaint: Epilepsy  Joan Shepard is a 20 y.o. female who was evaluated December 01, 2020 for the first time since September 05, 2019.  She has familial juvenile myoclonic epilepsy, condition each that she shares with her mother.  This is been well controlled with trade drug Depakote ER.  I did not want her to remain on this medication but she was not able to tolerate Lamictal nor did she respond to topiramate or zonisamide.  Similar issues occurred for her mother who has been well controlled on Depakote for decades.  We were able to retain her driver's license after her December 2020 visit.  She was advised to [place] but is transferred to a community college in the same community to pursue an associate' s degree in nursing.  She remains seizure-free and has for the past 14 months.  Her general health is good except for her weight gain.  She has gained 18 pounds since I saw her in December, 2020.  We did not talk about that today.  She recently had wisdom tooth surgery.  1 to 2 weeks ago she had an episode of dizziness while in the commode.  She was fatigued for much of the rest of the day.  It has not recurred.  She has some problems with falling asleep and takes melatonin.  We have talked for some time about my impending retirement.  We will ask Dr. Ellouise Newer to follow her.  She provides care to [facility] mother.  Review of Systems: A complete review of systems was remarkable for patient is here to be seen for epilepsy. She reports that she has not had any seizures since her last visit. She reports no concerns at this time., all other systems reviewed and negative.  Past Medical History Diagnosis Date   . Breast cyst   . Epilepsia ([place])   . Seizures ([place])    Hospitalizations: No., Head Injury: No., Nervous System Infections: No., Immunizations up to date: Yes.    Copied from prior chart notes She was admitted 4 days of life with perioral cellulitis and hospitalized for 7-10 days.  Seizures began in 2008. She was treated with Lamictal, but had problems with school performance, personality change, and weight gain. The medication was stopped and curiously she had no recurrent seizures until August 15, 2011.  EEG showed a photo-convulsive response.   EEG August 11, 2011 showed photoconvulsive response.   Prolonged ambulatory EEG showed seven episodes, three to five seconds in duration associated with clinical and compliments of unresponsive staring, rising of her arms, and shaking of them. She was placed on topiramate, which did not control her seizures. She was switched to zonisamide and had a flurry of seizures in early to mid December 2013.  In March 2014, she had a series of seizures at school where she lost balance,dropped her books,and had myoclonic movements of her arms and trunk without loss of consciousness.   A decision was made to start Depakote, which has proved beneficial for seizure control  Birth History 7 lbs. 6 oz. infant born at [redacted] weeks gestational age to a G 3 P 1 0 1 1 female. Gestation was uncomplicated Mother received Pitocin and Spinal anesthesia  normal spontaneous vaginal  delivery Nursery Course was uncomplicated Growth and Development was recalled as normal  Behavior History none  Surgical History Procedure Laterality Date  . BIOPSY BREAST     Family History family history includes ADD / ADHD in an other family member; Heart Problems in her maternal grandfather; Mood Disorder in an other family member; Seizures in her brother, maternal grandfather, maternal uncle, mother, and other family members. Family history is negative for  migraines, intellectual disabilities, blindness, deafness, birth defects, chromosomal disorder, or autism.  Social History Socioeconomic History  . Marital status: Single  . Years of education:  76  . Highest education level:  Completed 2 years in college and is now in community college in her first year  Occupational History  . Not currently employed  Tobacco Use  . Smoking status: Never Smoker  . Smokeless tobacco: Never Used  Substance and Sexual Activity  . Alcohol use: No  . Drug use: No  . Sexual activity: Never  Social History Narrative    [name] is a first year at [employer] in [place], [place]    Enjoys volunteering and volleyball.    Allergies Allergen Reactions  . Lamotrigine     Weight gain, and altered mood   Physical Exam BP 112/80   Pulse 72   Ht 5\' 2"  (1.575 m)   Wt 178 lb 4.8 oz (80.9 kg)   BMI 32.61 kg/m   General: alert, well developed, obese, in no acute distress, red hair, blue eyes, right handed Head: normocephalic, no dysmorphic features Ears, Nose and Throat: Otoscopic: tympanic membranes normal; pharynx: oropharynx is pink without exudates or tonsillar hypertrophy Neck: supple, full range of motion, no cranial or cervical bruits Respiratory: auscultation clear Cardiovascular: no murmurs, pulses are normal Musculoskeletal: no skeletal deformities or apparent scoliosis Skin: no rashes or neurocutaneous lesions  Neurologic Exam  Mental Status: alert; oriented to person, place and year; knowledge is normal for age; language is normal Cranial Nerves: visual fields are full to double simultaneous stimuli; extraocular movements are full and conjugate; pupils are round reactive to light; funduscopic examination shows sharp disc margins with normal vessels; symmetric facial strength; midline tongue and uvula; air conduction is greater than bone conduction bilaterally Motor: Normal strength, tone and mass; good fine motor  movements; no pronator drift Sensory: intact responses to cold, vibration, proprioception and stereognosis Coordination: good finger-to-nose, rapid repetitive alternating movements and finger apposition Gait and Station: normal gait and station: patient is able to walk on heels, toes and tandem without difficulty; balance is adequate; Romberg exam is negative; Gower response is negative Reflexes: symmetric and diminished bilaterally; no clonus; bilateral flexor plantar responses  Assessment 1.  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  Discussion I am pleased that K[place] is doing well.  There is no reason to change her current treatment.  Plan Prescription for Depakote ER trade drug was refilled.  We will refer her to Dr. Ellouise Newer for care in the next year.  Clarise Cruz is aware that I will take care of her until the transfer is complete up to June 24, 2021.  Greater than 50% of a 30-minute visit was spent in counseling and coordination of care with regard to her seizures and discussing long-term care with an adult neurologist.   Medication List   Accurate as of December 01, 2020 10:37 AM. If you have any questions, ask your nurse or doctor.    Depakote ER 250 MG 24 hr tablet Generic drug:  divalproex TAKE 3 TABLETS BY MOUTH AT BEDTIME   Lo Loestrin Fe 1 MG-10 MCG / 10 MCG tablet Generic drug: Norethindrone-Ethinyl Estradiol-Fe Biphas Take 1 tablet by mouth daily.   melatonin 5 MG Tabs Take 30 mg by mouth at bedtime.    The medication list was reviewed and reconciled. All changes or newly prescribed medications were explained.  A complete medication list was provided to the patient/caregiver.  Jodi Geralds MD

## 2020-12-03 ENCOUNTER — Encounter: Payer: Self-pay | Admitting: Neurology

## 2020-12-17 ENCOUNTER — Other Ambulatory Visit (HOSPITAL_BASED_OUTPATIENT_CLINIC_OR_DEPARTMENT_OTHER): Payer: Self-pay

## 2021-01-27 ENCOUNTER — Encounter (INDEPENDENT_AMBULATORY_CARE_PROVIDER_SITE_OTHER): Payer: Self-pay

## 2021-01-28 ENCOUNTER — Other Ambulatory Visit (HOSPITAL_COMMUNITY): Payer: Self-pay

## 2021-01-28 MED FILL — Norethin-Eth Estradiol-Fe Tab 1 MG-10 MCG (24)/10 MCG (2): ORAL | 28 days supply | Qty: 28 | Fill #0 | Status: CN

## 2021-01-28 MED FILL — Divalproex Sodium Tab ER 24 HR 250 MG: ORAL | 90 days supply | Qty: 270 | Fill #0 | Status: CN

## 2021-01-29 ENCOUNTER — Other Ambulatory Visit (HOSPITAL_COMMUNITY): Payer: Self-pay

## 2021-01-31 ENCOUNTER — Other Ambulatory Visit (HOSPITAL_COMMUNITY): Payer: Self-pay

## 2021-01-31 MED ORDER — LO LOESTRIN FE 1 MG-10 MCG / 10 MCG PO TABS
ORAL_TABLET | ORAL | 0 refills | Status: DC
  Filled 2021-01-31 – 2021-03-04 (×2): qty 84, 84d supply, fill #0

## 2021-02-04 ENCOUNTER — Other Ambulatory Visit (HOSPITAL_COMMUNITY): Payer: Self-pay

## 2021-03-04 ENCOUNTER — Other Ambulatory Visit (HOSPITAL_COMMUNITY): Payer: Self-pay

## 2021-03-04 MED FILL — Divalproex Sodium Tab ER 24 HR 250 MG: ORAL | 90 days supply | Qty: 270 | Fill #0 | Status: AC

## 2021-03-07 ENCOUNTER — Other Ambulatory Visit (HOSPITAL_COMMUNITY): Payer: Self-pay

## 2021-03-15 ENCOUNTER — Other Ambulatory Visit (HOSPITAL_COMMUNITY): Payer: Self-pay

## 2021-04-18 ENCOUNTER — Other Ambulatory Visit (INDEPENDENT_AMBULATORY_CARE_PROVIDER_SITE_OTHER): Payer: 59

## 2021-04-18 ENCOUNTER — Other Ambulatory Visit: Payer: Self-pay

## 2021-04-18 ENCOUNTER — Ambulatory Visit: Payer: 59 | Admitting: Neurology

## 2021-04-18 ENCOUNTER — Other Ambulatory Visit (HOSPITAL_COMMUNITY): Payer: Self-pay

## 2021-04-18 ENCOUNTER — Encounter: Payer: Self-pay | Admitting: Neurology

## 2021-04-18 VITALS — BP 119/78 | HR 92 | Ht 62.0 in | Wt 180.6 lb

## 2021-04-18 DIAGNOSIS — Z3041 Encounter for surveillance of contraceptive pills: Secondary | ICD-10-CM | POA: Diagnosis not present

## 2021-04-18 DIAGNOSIS — N926 Irregular menstruation, unspecified: Secondary | ICD-10-CM | POA: Diagnosis not present

## 2021-04-18 DIAGNOSIS — G40B09 Juvenile myoclonic epilepsy, not intractable, without status epilepticus: Secondary | ICD-10-CM | POA: Diagnosis not present

## 2021-04-18 DIAGNOSIS — Z01419 Encounter for gynecological examination (general) (routine) without abnormal findings: Secondary | ICD-10-CM | POA: Diagnosis not present

## 2021-04-18 DIAGNOSIS — Z01411 Encounter for gynecological examination (general) (routine) with abnormal findings: Secondary | ICD-10-CM | POA: Diagnosis not present

## 2021-04-18 DIAGNOSIS — Z6832 Body mass index (BMI) 32.0-32.9, adult: Secondary | ICD-10-CM | POA: Diagnosis not present

## 2021-04-18 DIAGNOSIS — Z124 Encounter for screening for malignant neoplasm of cervix: Secondary | ICD-10-CM | POA: Diagnosis not present

## 2021-04-18 DIAGNOSIS — Z113 Encounter for screening for infections with a predominantly sexual mode of transmission: Secondary | ICD-10-CM | POA: Diagnosis not present

## 2021-04-18 MED ORDER — DEPAKOTE ER 250 MG PO TB24
ORAL_TABLET | ORAL | 3 refills | Status: DC
  Filled 2021-04-18 – 2021-06-01 (×3): qty 270, 90d supply, fill #0
  Filled 2021-09-28: qty 270, 90d supply, fill #1
  Filled 2022-01-22: qty 270, 90d supply, fill #2

## 2021-04-18 MED ORDER — LO LOESTRIN FE 1 MG-10 MCG / 10 MCG PO TABS
1.0000 | ORAL_TABLET | Freq: Every day | ORAL | 4 refills | Status: AC
  Filled 2021-04-18 – 2021-05-11 (×2): qty 84, 84d supply, fill #0
  Filled 2021-08-28: qty 84, 84d supply, fill #1
  Filled 2021-11-21: qty 84, 84d supply, fill #2
  Filled 2022-02-16: qty 84, 84d supply, fill #3
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
## 2021-04-18 NOTE — Patient Instructions (Signed)
Good to meet you!   Continue Depakote ER '250mg'$ : take 3 tablets every night  2. Check Depakote level  3. Follow-up in 1 year, call for any changes   Seizure Precautions: 1. If medication has been prescribed for you to prevent seizures, take it exactly as directed.  Do not stop taking the medicine without talking to your doctor first, even if you have not had a seizure in a long time.   2. Avoid activities in which a seizure would cause danger to yourself or to others.  Don't operate dangerous machinery, swim alone, or climb in high or dangerous places, such as on ladders, roofs, or girders.  Do not drive unless your doctor says you may.  3. If you have any warning that you may have a seizure, lay down in a safe place where you can't hurt yourself.    4.  No driving for 6 months from last seizure, as per [facility].   Please refer to the following link on the [place] website for more information: http://[email or web address]/[email or web address]/drivingu.cfm   5.  Maintain good sleep hygiene. Avoid alcohol.  6.  Notify your neurology if you are planning pregnancy or if you become pregnant.  7.  Contact your doctor if you have any problems that may be related to the medicine you are taking.  8.  Call 911 and bring the patient back to the ED if:        A.  The seizure lasts longer than 5 minutes.       B.  The patient doesn't awaken shortly after the seizure  C.  The patient has new problems such as difficulty seeing, speaking or moving  D.  The patient was injured during the seizure  E.  The patient has a temperature over 102 F (39C)  F.  The patient vomited and now is having trouble breathing

## 2021-04-19 LAB — VALPROIC ACID LEVEL: Valproic Acid Lvl: 78.2 mg/L (ref 50.0–100.0)

## 2021-04-20 ENCOUNTER — Telehealth: Payer: Self-pay

## 2021-04-20 NOTE — Telephone Encounter (Signed)
-----   Message from Cameron Sprang, MD sent at 04/19/2021  8:43 AM EDT ----- Pls let Aislinn know the Depakote level looks good, within range, thanks

## 2021-04-20 NOTE — Telephone Encounter (Signed)
Pt called an informed that Depakote level looks good, within range

## 2021-05-11 ENCOUNTER — Other Ambulatory Visit (HOSPITAL_COMMUNITY): Payer: Self-pay

## 2021-05-16 ENCOUNTER — Encounter: Payer: Self-pay | Admitting: Neurology

## 2021-05-24 ENCOUNTER — Other Ambulatory Visit (HOSPITAL_COMMUNITY): Payer: Self-pay

## 2021-05-25 ENCOUNTER — Other Ambulatory Visit (HOSPITAL_COMMUNITY): Payer: Self-pay

## 2021-05-26 ENCOUNTER — Telehealth: Payer: Self-pay

## 2021-05-26 NOTE — Telephone Encounter (Signed)
New message   Pending   Akelia Laughton Key: [id number] - PA Case ID: [id number] help? Call us at [phone] Status Sent to [facility] Depakote ER '250MG'$  er tablets Form [employer] ePA Form 2017 NCPDP

## 2021-05-27 ENCOUNTER — Other Ambulatory Visit (HOSPITAL_COMMUNITY): Payer: Self-pay

## 2021-05-31 NOTE — Telephone Encounter (Signed)
F/u   Outcome Approvedon September 4 The request has been approved. The authorization is effective for a maximum of 12 fills from 05/28/2021 to 05/27/2022, as long as the member is enrolled in their current health plan. The request was reviewed and approved by a licensed clinical pharmacist. A written notification letter will follow with additional details.

## 2021-06-01 ENCOUNTER — Other Ambulatory Visit (HOSPITAL_COMMUNITY): Payer: Self-pay

## 2021-06-02 ENCOUNTER — Other Ambulatory Visit (HOSPITAL_COMMUNITY): Payer: Self-pay

## 2021-06-03 ENCOUNTER — Other Ambulatory Visit (HOSPITAL_COMMUNITY): Payer: Self-pay

## 2021-07-17 ENCOUNTER — Telehealth: Payer: 59 | Admitting: Family

## 2021-07-17 DIAGNOSIS — J069 Acute upper respiratory infection, unspecified: Secondary | ICD-10-CM

## 2021-07-17 MED ORDER — BENZONATATE 100 MG PO CAPS
100.0000 mg | ORAL_CAPSULE | Freq: Three times a day (TID) | ORAL | 0 refills | Status: DC | PRN
Start: 2021-07-17 — End: 2021-07-17

## 2021-07-17 MED ORDER — FLUTICASONE PROPIONATE 50 MCG/ACT NA SUSP: 2.0000 | Freq: Every day | NASAL | 6 refills | Status: DC

## 2021-07-17 MED ORDER — BENZONATATE 100 MG PO CAPS: 100.0000 mg | ORAL_CAPSULE | Freq: Three times a day (TID) | ORAL | 0 refills | Status: DC | PRN

## 2021-07-17 MED ORDER — FLUTICASONE PROPIONATE 50 MCG/ACT NA SUSP: 2.0000 | Freq: Every day | NASAL | 6 refills | Status: AC

## 2021-07-17 NOTE — Progress Notes (Signed)
Virtual Visit Consent   Joan Shepard, you are scheduled for a virtual visit with a [facility] [place] provider today.     Just as with appointments in the office, your consent must be obtained to participate.  Your consent will be active for this visit and any virtual visit you may have with one of our providers in the next 365 days.     If you have a MyChart account, a copy of this consent can be sent to you electronically.  All virtual visits are billed to your insurance company just like a traditional visit in the office.    As this is a virtual visit, video technology does not allow for your provider to perform a traditional examination.  This may limit your provider's ability to fully assess your condition.  If your provider identifies any concerns that need to be evaluated in person or the need to arrange testing (such as labs, EKG, etc.), we will make arrangements to do so.     Although advances in technology are sophisticated, we cannot ensure that it will always work on either your end or our end.  If the connection with a video visit is poor, the visit may have to be switched to a telephone visit.  With either a video or telephone visit, we are not always able to ensure that we have a secure connection.     I need to obtain your verbal consent now.   Are you willing to proceed with your visit today?    Joan Shepard has provided verbal consent on 07/17/2021 for a virtual visit (video or telephone).   Joan Dun, FNP   Date: 07/17/2021 7:00 PM   Virtual Visit via Video Note   I, Joan Shepard, connected with  Joan Shepard  ([id number], 24-Mar-2001) on 07/17/21 at  7:00 PM EDT by a video-enabled telemedicine application and verified that I am speaking with the correct person using two identifiers.  Location: Patient: Virtual Visit Location Patient: Home Provider: Virtual Visit Location Provider: Home   I discussed the limitations of evaluation and management by telemedicine and the  availability of in person appointments. The patient expressed understanding and agreed to proceed.    History of Present Illness: Joan Shepard is a 20 y.o. who identifies as a female who was assigned female at birth, and is being seen today for cough.  HPI: Cough This is a new problem. The problem has been waxing and waning. The cough is Non-productive. Associated symptoms include chills, ear pain, a fever (100.3), headaches, myalgias, nasal congestion, postnasal drip and a sore throat. Pertinent negatives include no ear congestion, shortness of breath or wheezing. She has tried rest and OTC cough suppressant for the symptoms. The treatment provided mild relief.   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   Localization-related (focal) (partial) epilepsy and epileptic syndromes with complex partial seizures, without mention of intractable epilepsy 02/21/2013   Encounter for long-term (current) use of other medications 02/21/2013    Allergies:  Allergies  Allergen Reactions   Lamotrigine     Weight gain,  and altered mood   Medications:  Current Outpatient Medications:    benzonatate (TESSALON PERLES) 100 MG capsule, Take 1 capsule (100 mg total) by mouth 3 (three) times daily as needed., Disp: 20 capsule, Rfl: 0   DEPAKOTE ER 250 MG 24 hr tablet, TAKE 3 TABLETS (750 MG TOTAL) BY MOUTH AT BEDTIME., Disp: 270 tablet, Rfl: 3   diphenhydrAMINE (BENADRYL) 50 MG tablet, Take 50 mg by mouth at bedtime as needed for itching., Disp: , Rfl:    fluticasone (FLONASE)  50 MCG/ACT nasal spray, Place 2 sprays into both nostrils daily., Disp: 16 g, Rfl: 6   Melatonin 5 MG TABS, Take 30 mg by mouth at bedtime., Disp: , Rfl:    Norethindrone-Ethinyl Estradiol-Fe Biphas (LO LOESTRIN FE) 1 MG-10 MCG / 10 MCG tablet, TAKE 1 TABLET BY MOUTH EVERY DAY, Disp: 84 tablet, Rfl: 4  Observations/Objective: Patient is well-developed, well-nourished in no acute distress.  Resting comfortably  at home.  Head is normocephalic, atraumatic.  No labored breathing.  Speech is clear and coherent with logical content.  Patient is alert and oriented at baseline.  Nasal congestion  Assessment and Plan: 1. Viral URI with cough - benzonatate (TESSALON PERLES) 100 MG capsule; Take 1 capsule (100 mg total) by mouth 3 (three) times daily as needed.  Dispense: 20 capsule; Refill: 0 - fluticasone (FLONASE) 50 MCG/ACT nasal spray; Place 2 sprays into both nostrils daily.  Dispense: 16 g; Refill: 6 - Take meds as prescribed - Use a cool mist humidifier  -Use saline nose sprays frequently -Force fluids -For any cough or congestion  Use plain Mucinex- regular strength or max strength is fine -For fever or aces or pains- take tylenol or ibuprofen. -Throat lozenges if help -New toothbrush in 3 days She will take COVID test to rule out  Follow Up Instructions: I discussed the assessment and treatment plan with the patient. The patient was provided an opportunity to ask questions and all were answered. The patient agreed with the plan and demonstrated an understanding of the instructions.  A copy of instructions were sent to the patient via MyChart unless otherwise noted below.     The patient was advised to call back or seek an in-person evaluation if the symptoms worsen or if the condition fails to improve as anticipated.  Time:  I spent 16 minutes with the patient via telehealth technology discussing the above problems/concerns.    Joan Dun, FNP

## 2021-07-17 NOTE — Patient Instructions (Signed)
Upper Respiratory Infection, Adult An upper respiratory infection (URI) is a common viral infection of the nose, throat, and upper air passages that lead to the lungs. The most common type of URI is the common cold. URIs usually get better on their own, without medical treatment. What are the causes? A URI is caused by a virus. You may catch a virus by: Breathing in droplets from an infected person's cough or sneeze. Touching something that has been exposed to the virus (contaminated) and then touching your mouth, nose, or eyes. What increases the risk? You are more likely to get a URI if: You are very young or very old. It is autumn or winter. You have close contact with others, such as at a daycare, school, or health care facility. You smoke. You have long-term (chronic) heart or lung disease. You have a weakened disease-fighting (immune) system. You have nasal allergies or asthma. You are experiencing a lot of stress. You work in an area that has poor air circulation. You have poor nutrition. What are the signs or symptoms? A URI usually involves some of the following symptoms: Runny or stuffy (congested) nose. Sneezing. Cough. Sore throat. Headache. Fatigue. Fever. Loss of appetite. Pain in your forehead, behind your eyes, and over your cheekbones (sinus pain). Muscle aches. Redness or irritation of the eyes. Pressure in the ears or face. How is this diagnosed? This condition may be diagnosed based on your medical history and symptoms, and a physical exam. Your health care provider may use a cotton swab to take a mucus sample from your nose (nasal swab). This sample can be tested to determine what virus is causing the illness. How is this treated? URIs usually get better on their own within 7-10 days. You can take steps at home to relieve your symptoms. Medicines cannot cure URIs, but your health care provider may recommend certain medicines to help relieve symptoms, such  as: Over-the-counter cold medicines. Cough suppressants. Coughing is a type of defense against infection that helps to clear the respiratory system, so take these medicines only as recommended by your health care provider. Fever-reducing medicines. Follow these instructions at home: Activity Rest as needed. If you have a fever, stay home from work or school until your fever is gone or until your health care provider says you are no longer contagious. Your health care provider may have you wear a face mask to prevent your infection from spreading. Relieving symptoms Gargle with a salt-water mixture 3-4 times a day or as needed. To make a salt-water mixture, completely dissolve -1 tsp of salt in 1 cup of warm water. Use a cool-mist humidifier to add moisture to the air. This can help you breathe more easily. Eating and drinking  Drink enough fluid to keep your urine pale yellow. Eat soups and other clear broths. General instructions  Take over-the-counter and prescription medicines only as told by your health care provider. These include cold medicines, fever reducers, and cough suppressants. Do not use any products that contain nicotine or tobacco, such as cigarettes and e-cigarettes. If you need help quitting, ask your health care provider. Stay away from secondhand smoke. Stay up to date on all immunizations, including the yearly (annual) flu vaccine. Keep all follow-up visits as told by your health care provider. This is important. How to prevent the spread of infection to others  URIs can be passed from person to person (are contagious). To prevent the infection from spreading: Wash your hands often with soap and   water. If soap and water are not available, use hand sanitizer. Avoid touching your mouth, face, eyes, or nose. Cough or sneeze into a tissue or your sleeve or elbow instead of into your hand or into the air. Contact a health care provider if: You are getting worse instead  of better. You have a fever or chills. Your mucus is Rash or red. You have yellow or Courtney discharge coming from your nose. You have pain in your face, especially when you bend forward. You have swollen neck glands. You have pain while swallowing. You have white areas in the back of your throat. Get help right away if: You have shortness of breath that gets worse. You have severe or persistent: Headache. Ear pain. Sinus pain. Chest pain. You have chronic lung disease along with any of the following: Wheezing. Prolonged cough. Coughing up blood. A change in your usual mucus. You have a stiff neck. You have changes in your: Vision. Hearing. Thinking. Mood. Summary An upper respiratory infection (URI) is a common infection of the nose, throat, and upper air passages that lead to the lungs. A URI is caused by a virus. URIs usually get better on their own within 7-10 days. Medicines cannot cure URIs, but your health care provider may recommend certain medicines to help relieve symptoms. This information is not intended to replace advice given to you by your health care provider. Make sure you discuss any questions you have with your health care provider. Document Revised: 05/20/2020 Document Reviewed: 05/20/2020 Elsevier Patient Education  2022 [employer]  

## 2021-07-20 ENCOUNTER — Telehealth: Payer: 59 | Admitting: Nurse Practitioner

## 2021-07-20 DIAGNOSIS — J40 Bronchitis, not specified as acute or chronic: Secondary | ICD-10-CM

## 2021-07-20 MED ORDER — AZITHROMYCIN 250 MG PO TABS: ORAL_TABLET | ORAL | 0 refills | Status: DC

## 2021-07-20 MED ORDER — PREDNISONE 20 MG PO TABS: 40.0000 mg | ORAL_TABLET | Freq: Every day | ORAL | 0 refills | Status: AC

## 2021-07-20 MED ORDER — ALBUTEROL SULFATE HFA 108 (90 BASE) MCG/ACT IN AERS: 2.0000 | INHALATION_SPRAY | Freq: Four times a day (QID) | RESPIRATORY_TRACT | 0 refills | Status: AC | PRN

## 2021-07-20 NOTE — Progress Notes (Signed)
Virtual Visit Consent   Joan Shepard, you are scheduled for a virtual visit with Joan Shepard, [place], a [facility] provider, today.     Just as with appointments in the office, your consent must be obtained to participate.  Your consent will be active for this visit and any virtual visit you may have with one of our providers in the next 365 days.     If you have a MyChart account, a copy of this consent can be sent to you electronically.  All virtual visits are billed to your insurance company just like a traditional visit in the office.    As this is a virtual visit, video technology does not allow for your provider to perform a traditional examination.  This may limit your provider's ability to fully assess your condition.  If your provider identifies any concerns that need to be evaluated in person or the need to arrange testing (such as labs, EKG, etc.), we will make arrangements to do so.     Although advances in technology are sophisticated, we cannot ensure that it will always work on either your end or our end.  If the connection with a video visit is poor, the visit may have to be switched to a telephone visit.  With either a video or telephone visit, we are not always able to ensure that we have a secure connection.     I need to obtain your verbal consent now.   Are you willing to proceed with your visit today? YES   Joan Shepard has provided verbal consent on 07/20/2021 for a virtual visit (video or telephone).   Joan Hassell Done, FNP   Date: 07/20/2021 4:45 PM   Virtual Visit via Video Note   I, Joan Shepard, connected with Joan Shepard ([id number], October 04, 2000) on 07/20/21 at  4:45 PM EDT by a video-enabled telemedicine application and verified that I am speaking with the correct person using two identifiers.  Location: Patient: Virtual Visit Location Patient: Home Provider: Virtual Visit Location Provider: Mobile   I discussed the limitations of  evaluation and management by telemedicine and the availability of in person appointments. The patient expressed understanding and agreed to proceed.    History of Present Illness: Joan Shepard is a 20 y.o. who identifies as a female who was assigned female at birth, and is being seen today for cough.  HPI: Patient did a video visit on Sunday night with C. Hawks. She was dx with URI and was given an antitussin and flonase. Since then her cough has gotten worse and she cant function. She has also been taking mucinex and motrin and is no better. She cant sleep from coughing so much.   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   Localization-related (focal) (partial) epilepsy and epileptic syndromes with complex partial seizures, without mention of intractable epilepsy 02/21/2013   Encounter for long-term (current) use of other medications 02/21/2013    Allergies:  Allergies  Allergen Reactions   Lamotrigine     Weight gain, and altered mood   Medications:  Current  Outpatient Medications:    benzonatate (TESSALON PERLES) 100 MG capsule, Take 1 capsule (100 mg total) by mouth 3 (three) times daily as needed., Disp: 20 capsule, Rfl: 0   DEPAKOTE ER 250 MG 24 hr tablet, TAKE 3 TABLETS (750 MG TOTAL) BY MOUTH AT BEDTIME., Disp: 270 tablet, Rfl: 3   diphenhydrAMINE (BENADRYL) 50 MG tablet, Take 50 mg by mouth at bedtime as needed for itching., Disp: , Rfl:    fluticasone (FLONASE) 50 MCG/ACT nasal spray, Place 2 sprays into both nostrils daily., Disp: 16 g, Rfl:  6   Melatonin 5 MG TABS, Take 30 mg by mouth at bedtime., Disp: , Rfl:    Norethindrone-Ethinyl Estradiol-Fe Biphas (LO LOESTRIN FE) 1 MG-10 MCG / 10 MCG tablet, TAKE 1 TABLET BY MOUTH EVERY DAY, Disp: 84 tablet, Rfl: 4  Observations/Objective: Patient is well-developed, well-nourished in no acute distress.  Resting comfortably  at home.  Head is normocephalic, atraumatic.  No labored breathing.  Speech is clear and coherent with logical content.  Patient is alert and oriented at baseline.  Patient could not hardly talk from coughing so much.  Assessment and Plan:  Joan Shepard in today with chief complaint of No chief complaint on file.   1. Bronchitis Force fluids Rest Meds ordered this encounter  Medications   azithromycin (ZITHROMAX Z-PAK) 250 MG tablet    Sig: As directed    Dispense:  6 tablet    Refill:  0    Order Specific Question:   Supervising Provider    Answer:   Sabra Heck, BRIAN [[id number]]   predniSONE (DELTASONE) 20 MG tablet    Sig: Take 2 tablets (40 mg total) by mouth daily with breakfast for 5 days. 2 po daily for 5 days    Dispense:  10 tablet    Refill:  0    Order Specific Question:   Supervising Provider    Answer:   Sabra Heck, BRIAN [[id number]]   albuterol (VENTOLIN HFA) 108 (90 Base) MCG/ACT inhaler    Sig: Inhale 2 puffs into the lungs every 6 (six) hours as needed for wheezing or shortness of breath.    Dispense:  8 g    Refill:  0    Order Specific Question:   Supervising Provider    Answer:   Noemi Chapel [[id number]]      Follow Up Instructions: I discussed the assessment and treatment plan with the patient. The patient was provided an opportunity to ask questions and all were answered. The patient agreed with the plan and demonstrated an understanding of the instructions.  A copy of instructions were sent to the patient via MyChart.  The patient was advised to call back or seek an in-person evaluation if the symptoms worsen or if the condition fails to  improve as anticipated.  Time:  I spent 10 minutes with the patient via telehealth technology discussing the above problems/concerns.    Joan Hassell Done, FNP

## 2021-08-29 ENCOUNTER — Other Ambulatory Visit (HOSPITAL_COMMUNITY): Payer: Self-pay

## 2021-08-30 ENCOUNTER — Encounter: Payer: Self-pay | Admitting: Neurology

## 2021-09-29 ENCOUNTER — Other Ambulatory Visit (HOSPITAL_COMMUNITY): Payer: Self-pay

## 2021-09-30 ENCOUNTER — Other Ambulatory Visit (HOSPITAL_COMMUNITY): Payer: Self-pay

## 2021-11-05 ENCOUNTER — Other Ambulatory Visit (HOSPITAL_COMMUNITY): Payer: Self-pay

## 2021-11-22 ENCOUNTER — Other Ambulatory Visit (HOSPITAL_COMMUNITY): Payer: Self-pay

## 2022-01-23 ENCOUNTER — Other Ambulatory Visit (HOSPITAL_COMMUNITY): Payer: Self-pay

## 2022-01-24 ENCOUNTER — Other Ambulatory Visit (HOSPITAL_COMMUNITY): Payer: Self-pay

## 2022-02-17 ENCOUNTER — Other Ambulatory Visit (HOSPITAL_COMMUNITY): Payer: Self-pay

## 2022-03-16 ENCOUNTER — Telehealth: Payer: Self-pay | Admitting: Neurology

## 2022-03-16 NOTE — Telephone Encounter (Signed)
Patient is going to stay in [place] where she went to school. She would like to know if aquino can recommend a neurologist up that way. She wants to know if she can have a VV 04/18/22. Would like to know how many refills she can get since she will be staying in [place]

## 2022-03-17 NOTE — Telephone Encounter (Signed)
Pls let her know I can send refills for a year. I don't personally know any of the [facility] neurologists, but it looks like there are 2 Neurology groups there, it may also depend on who is covered by her insurance. Ok for VV, thanks

## 2022-03-20 ENCOUNTER — Other Ambulatory Visit: Payer: Self-pay

## 2022-03-20 ENCOUNTER — Other Ambulatory Visit (HOSPITAL_COMMUNITY): Payer: Self-pay

## 2022-03-20 DIAGNOSIS — G40B09 Juvenile myoclonic epilepsy, not intractable, without status epilepticus: Secondary | ICD-10-CM

## 2022-03-20 MED ORDER — DIVALPROEX SODIUM ER 250 MG PO TB24
250.0000 mg | ORAL_TABLET | Freq: Every day | ORAL | 3 refills | Status: DC
  Filled 2022-03-20: qty 270, 270d supply, fill #0

## 2022-03-20 MED ORDER — DEPAKOTE ER 250 MG PO TB24
ORAL_TABLET | ORAL | 3 refills | Status: DC
  Filled 2022-03-20: qty 270, fill #0

## 2022-03-20 MED ORDER — DEPAKOTE ER 250 MG PO TB24: ORAL_TABLET | ORAL | 3 refills | Status: DC

## 2022-03-20 NOTE — Telephone Encounter (Signed)
Called pt and informed her of Dr. Elson Clan answers. Said that she was going to change her appointment to another day, she is in class that day. Send messaged to front/ she is changing it in My Chart

## 2022-03-29 ENCOUNTER — Encounter: Payer: Self-pay | Admitting: Neurology

## 2022-03-31 ENCOUNTER — Other Ambulatory Visit (HOSPITAL_COMMUNITY): Payer: Self-pay

## 2022-03-31 MED ORDER — LO LOESTRIN FE 1 MG-10 MCG / 10 MCG PO TABS
1.0000 | ORAL_TABLET | Freq: Every day | ORAL | 0 refills | Status: DC
  Filled 2022-03-31 – 2022-04-19 (×2): qty 84, 84d supply, fill #0

## 2022-04-18 ENCOUNTER — Other Ambulatory Visit (HOSPITAL_COMMUNITY): Payer: Self-pay

## 2022-04-18 ENCOUNTER — Telehealth (INDEPENDENT_AMBULATORY_CARE_PROVIDER_SITE_OTHER): Payer: 59 | Admitting: Neurology

## 2022-04-18 ENCOUNTER — Encounter: Payer: Self-pay | Admitting: Neurology

## 2022-04-18 DIAGNOSIS — G40B09 Juvenile myoclonic epilepsy, not intractable, without status epilepticus: Secondary | ICD-10-CM

## 2022-04-18 MED ORDER — DEPAKOTE ER 250 MG PO TB24
ORAL_TABLET | ORAL | 3 refills | Status: DC
  Filled 2022-04-18: qty 270, 90d supply, fill #0
  Filled 2022-07-28 (×2): qty 270, 90d supply, fill #1
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
## 2022-04-18 NOTE — Patient Instructions (Signed)
Good to see you doing well. Continue to monitor the night time jerks, if they increase in frequency, please let me know. Let me know once you have found a local neurologist and we will send records. Follow-up as needed, call for any changes.    Seizure Precautions: 1. If medication has been prescribed for you to prevent seizures, take it exactly as directed.  Do not stop taking the medicine without talking to your doctor first, even if you have not had a seizure in a long time.   2. Avoid activities in which a seizure would cause danger to yourself or to others.  Don't operate dangerous machinery, swim alone, or climb in high or dangerous places, such as on ladders, roofs, or girders.  Do not drive unless your doctor says you may.  3. If you have any warning that you may have a seizure, lay down in a safe place where you can't hurt yourself.    4.  No driving for 6 months from last seizure, as per [facility].   Please refer to the following link on the [place] website for more information: http://www.epilepsyfoundation.org/answerplace/Social/driving/drivingu.cfm   5.  Maintain good sleep hygiene. Avoid alcohol.  6.  Notify your neurology if you are planning pregnancy or if you become pregnant.  7.  Contact your doctor if you have any problems that may be related to the medicine you are taking.  8.  Call 911 and bring the patient back to the ED if:        A.  The seizure lasts longer than 5 minutes.       B.  The patient doesn't awaken shortly after the seizure  C.  The patient has new problems such as difficulty seeing, speaking or moving  D.  The patient was injured during the seizure  E.  The patient has a temperature over 102 F (39C)  F.  The patient vomited and now is having trouble breathing

## 2022-04-19 ENCOUNTER — Other Ambulatory Visit (HOSPITAL_COMMUNITY): Payer: Self-pay

## 2022-04-24 ENCOUNTER — Other Ambulatory Visit (HOSPITAL_COMMUNITY): Payer: Self-pay

## 2022-04-25 ENCOUNTER — Other Ambulatory Visit (HOSPITAL_COMMUNITY): Payer: Self-pay

## 2022-05-12 ENCOUNTER — Other Ambulatory Visit (HOSPITAL_COMMUNITY): Payer: Self-pay

## 2022-05-12 DIAGNOSIS — E233 Hypothalamic dysfunction, not elsewhere classified: Secondary | ICD-10-CM | POA: Diagnosis not present

## 2022-05-12 DIAGNOSIS — Z01419 Encounter for gynecological examination (general) (routine) without abnormal findings: Secondary | ICD-10-CM | POA: Diagnosis not present

## 2022-05-12 DIAGNOSIS — Z6832 Body mass index (BMI) 32.0-32.9, adult: Secondary | ICD-10-CM | POA: Diagnosis not present

## 2022-05-12 DIAGNOSIS — R233 Spontaneous ecchymoses: Secondary | ICD-10-CM | POA: Diagnosis not present

## 2022-05-12 MED ORDER — LO LOESTRIN FE 1 MG-10 MCG / 10 MCG PO TABS
1.0000 | ORAL_TABLET | Freq: Every day | ORAL | 4 refills | Status: AC
  Filled 2022-05-12 – 2022-07-28 (×2): qty 84, 84d supply, fill #0

## 2022-07-28 ENCOUNTER — Telehealth: Payer: Self-pay | Admitting: Neurology

## 2022-07-28 ENCOUNTER — Other Ambulatory Visit (HOSPITAL_COMMUNITY): Payer: Self-pay

## 2022-07-28 DIAGNOSIS — G40B09 Juvenile myoclonic epilepsy, not intractable, without status epilepticus: Secondary | ICD-10-CM

## 2022-07-28 NOTE — Telephone Encounter (Signed)
1. Which medications need refilled? (List name and dosage, if known) depakote, brand name only  2. Which pharmacy/location is medication to be sent to? (include street and city if local pharmacy) [place]  3. Do they need a 30 day or 90 day supply? 90  Pharmacy told her her it needs a PA for brand name only.

## 2022-07-31 ENCOUNTER — Other Ambulatory Visit (HOSPITAL_COMMUNITY): Payer: Self-pay

## 2022-07-31 NOTE — Telephone Encounter (Signed)
Patient left message to say she has not been able to get her refill on Depakote. Per pharmacy PA still pending.

## 2022-07-31 NOTE — Telephone Encounter (Signed)
Pt called informed that we are working on her PA for her Depakote

## 2022-08-01 ENCOUNTER — Telehealth: Payer: Self-pay

## 2022-08-01 ENCOUNTER — Other Ambulatory Visit (HOSPITAL_COMMUNITY): Payer: Self-pay

## 2022-08-01 NOTE — Telephone Encounter (Signed)
Submitted prior auth via CoverMyMeds on 08/01/22 for the Depakote ER '250mg'$ .   Status is pending.  Key: Nicki [place]

## 2022-08-01 NOTE — Telephone Encounter (Signed)
Pharmacy Patient Advocate Encounter   Received notification that prior authorization for Depakote ER '250mg'$  is required/requested.     PA submitted on 08/01/22 to [employer]  via CoverMyMeds  Key [id number]   PA Case ID: [id number]  Status is pending

## 2022-08-03 ENCOUNTER — Other Ambulatory Visit (HOSPITAL_COMMUNITY): Payer: Self-pay

## 2022-08-03 MED ORDER — DEPAKOTE ER 250 MG PO TB24
750.0000 mg | ORAL_TABLET | Freq: Every day | ORAL | 3 refills | Status: DC
  Filled 2022-08-03: qty 270, 90d supply, fill #0

## 2022-08-03 NOTE — Telephone Encounter (Signed)
Hello, any update on this? I don't want her to run out of seizure medication. Thank you!

## 2022-08-03 NOTE — Telephone Encounter (Signed)
Thank you :)

## 2022-08-03 NOTE — Telephone Encounter (Signed)
Pt called informed PA approved

## 2022-08-03 NOTE — Telephone Encounter (Signed)
Patient Advocate Encounter  Prior Authorization for Depakote ER '250MG'$  er tablets has been approved.    PA# [id number] Key: [id number]  Effective dates: 08/03/2022 through 08/03/2023

## 2022-08-03 NOTE — Addendum Note (Signed)
Addended by: Jake Seats on: 08/03/2022 02:11 PM   Modules accepted: Orders

## 2022-08-03 NOTE — Telephone Encounter (Signed)
Patient Advocate Encounter   Prior Authorization for Depakote ER '250MG'$  er tablets has been approved.     PA# [id number] Key: [id number]   Effective dates: 08/03/2022 through 08/03/2023

## 2022-08-07 DIAGNOSIS — R42 Dizziness and giddiness: Secondary | ICD-10-CM | POA: Diagnosis not present

## 2022-08-07 DIAGNOSIS — H66002 Acute suppurative otitis media without spontaneous rupture of ear drum, left ear: Secondary | ICD-10-CM | POA: Diagnosis not present

## 2022-08-07 DIAGNOSIS — U071 COVID-19: Secondary | ICD-10-CM | POA: Diagnosis not present

## 2022-08-07 DIAGNOSIS — J069 Acute upper respiratory infection, unspecified: Secondary | ICD-10-CM | POA: Diagnosis not present

## 2022-10-23 ENCOUNTER — Other Ambulatory Visit (HOSPITAL_COMMUNITY): Payer: Self-pay

## 2022-10-30 ENCOUNTER — Telehealth: Payer: Self-pay | Admitting: Pharmacy Technician

## 2022-10-30 ENCOUNTER — Encounter: Payer: Self-pay | Admitting: Neurology

## 2022-10-30 ENCOUNTER — Other Ambulatory Visit (HOSPITAL_COMMUNITY): Payer: Self-pay

## 2022-10-30 ENCOUNTER — Telehealth: Payer: Self-pay | Admitting: Anesthesiology

## 2022-10-30 ENCOUNTER — Other Ambulatory Visit: Payer: Self-pay

## 2022-10-30 DIAGNOSIS — G40B09 Juvenile myoclonic epilepsy, not intractable, without status epilepticus: Secondary | ICD-10-CM

## 2022-10-30 MED ORDER — DEPAKOTE ER 250 MG PO TB24: 750.0000 mg | ORAL_TABLET | Freq: Every day | ORAL | 0 refills | Status: DC

## 2022-10-30 NOTE — Telephone Encounter (Signed)
Pt has new insurance she is going to send [place] her new insurance card so we can do a new PA

## 2022-10-30 NOTE — Telephone Encounter (Signed)
Patient Advocate Encounter   Received notification  that prior authorization for Depakote ER '250MG'$  er tablets is required.   PA submitted on 10/30/2022 Key [id number] Status is pending       Lyndel Safe, [place] Patient Advocate Specialist [place] Patient Advocate Team Direct Number: [phone]  Fax: [phone]

## 2022-10-30 NOTE — Telephone Encounter (Addendum)
[facility], they are needing a PA completed for this. Said if we need the key then to contact them and they can provide it.

## 2022-10-30 NOTE — Telephone Encounter (Signed)
Pt called stating that her medication Depakote  250 mg brand name needs to go to her new pharmacy. Pt would like to get her medication sent in today if possible to [facility] fax [phone].

## 2022-10-30 NOTE — Telephone Encounter (Signed)
Refill sent in for pt. 

## 2022-10-31 NOTE — Telephone Encounter (Signed)
PA has been APPROVED through 04/30/2023

## 2022-11-06 ENCOUNTER — Telehealth: Payer: Self-pay | Admitting: Anesthesiology

## 2022-11-06 ENCOUNTER — Other Ambulatory Visit: Payer: Self-pay

## 2022-11-06 DIAGNOSIS — G40B09 Juvenile myoclonic epilepsy, not intractable, without status epilepticus: Secondary | ICD-10-CM

## 2022-11-06 NOTE — Telephone Encounter (Signed)
Ok to send referral as requested for transfer of care. Pls send my notes. Thanks

## 2022-11-06 NOTE — Telephone Encounter (Signed)
Pt left message stating she has moved and she would like for Dr Delice Lesch to send a referral for her to [facility] in [place], Fax number [phone].

## 2022-11-07 NOTE — Telephone Encounter (Signed)
Called pt due to number that was given was not working, She called and give me the right number. I faxed papers.

## 2022-11-30 ENCOUNTER — Other Ambulatory Visit (HOSPITAL_COMMUNITY): Payer: Self-pay

## 2022-11-30 ENCOUNTER — Other Ambulatory Visit: Payer: Self-pay

## 2022-11-30 DIAGNOSIS — G40B09 Juvenile myoclonic epilepsy, not intractable, without status epilepticus: Secondary | ICD-10-CM

## 2022-11-30 MED ORDER — DEPAKOTE ER 250 MG PO TB24
750.0000 mg | ORAL_TABLET | Freq: Every day | ORAL | 0 refills | Status: AC
  Filled 2022-11-30: qty 270, 90d supply, fill #0

## 2022-11-30 NOTE — Telephone Encounter (Signed)
PA sent to pt pharmacy

## 2023-05-03 ENCOUNTER — Telehealth: Payer: Self-pay | Admitting: Pharmacy Technician

## 2023-05-03 ENCOUNTER — Other Ambulatory Visit (HOSPITAL_COMMUNITY): Payer: Self-pay

## 2023-05-03 NOTE — Telephone Encounter (Signed)
Pharmacy Patient Advocate Encounter   Received notification from CoverMyMeds that prior authorization for DEPAKOTE ER 250MG  is required/requested.   Insurance verification completed.   The patient is insured through [facility] .   Per test claim: PA required; PA submitted to [facility] via CoverMyMeds Key/confirmation #/EOC [id number] Status is pending

## 2024-05-27 ENCOUNTER — Other Ambulatory Visit: Payer: Self-pay

## 2024-06-04 ENCOUNTER — Other Ambulatory Visit: Payer: Self-pay

## 2024-07-17 ENCOUNTER — Other Ambulatory Visit: Payer: Self-pay
